# Patient Record
Sex: Male | Born: 1975 | Race: Black or African American | Hispanic: No | Marital: Married | Smoking: Never smoker
Health system: Southern US, Community
[De-identification: ages and names within clinical notes are randomized; demographics above are authoritative.]

## PROBLEM LIST (undated history)

## (undated) HISTORY — PX: APPENDECTOMY: SHX54

## (undated) HISTORY — PX: OTHER SURGICAL HISTORY: SHX169

---

## 2013-11-21 ENCOUNTER — Encounter (HOSPITAL_BASED_OUTPATIENT_CLINIC_OR_DEPARTMENT_OTHER): Payer: Self-pay | Admitting: Emergency Medicine

## 2013-11-21 ENCOUNTER — Emergency Department (HOSPITAL_BASED_OUTPATIENT_CLINIC_OR_DEPARTMENT_OTHER)
Admission: EM | Admit: 2013-11-21 | Discharge: 2013-11-21 | Disposition: A | Discharge status: Home / Self Care | Payer: Self-pay | Attending: Emergency Medicine | Admitting: Emergency Medicine

## 2013-11-21 ENCOUNTER — Emergency Department (HOSPITAL_BASED_OUTPATIENT_CLINIC_OR_DEPARTMENT_OTHER): Payer: Self-pay

## 2013-11-21 DIAGNOSIS — S82839A Other fracture of upper and lower end of unspecified fibula, initial encounter for closed fracture: Secondary | ICD-10-CM | POA: Insufficient documentation

## 2013-11-21 DIAGNOSIS — S82402A Unspecified fracture of shaft of left fibula, initial encounter for closed fracture: Secondary | ICD-10-CM

## 2013-11-21 DIAGNOSIS — Y9302 Activity, running: Secondary | ICD-10-CM | POA: Insufficient documentation

## 2013-11-21 DIAGNOSIS — Y9229 Other specified public building as the place of occurrence of the external cause: Secondary | ICD-10-CM | POA: Insufficient documentation

## 2013-11-21 DIAGNOSIS — X500XXA Overexertion from strenuous movement or load, initial encounter: Secondary | ICD-10-CM | POA: Insufficient documentation

## 2013-11-21 MED ORDER — HYDROCODONE-ACETAMINOPHEN 5-325 MG PO TABS: 1.0000 | ORAL_TABLET | Freq: Four times a day (QID) | ORAL | Status: DC | PRN

## 2013-11-21 MED ORDER — OXYCODONE-ACETAMINOPHEN 5-325 MG PO TABS
1.0000 | ORAL_TABLET | Freq: Four times a day (QID) | ORAL | Status: AC | PRN
Start: 2013-11-21 — End: ?

## 2013-11-21 MED ORDER — IBUPROFEN 600 MG PO TABS: 600.0000 mg | ORAL_TABLET | Freq: Four times a day (QID) | ORAL | Status: AC | PRN

## 2013-11-21 MED ORDER — OXYCODONE-ACETAMINOPHEN 5-325 MG PO TABS
1.0000 | ORAL_TABLET | Freq: Once | ORAL | Status: AC
  Administered 2013-11-21: 1 via ORAL
  Filled 2013-11-21: qty 1

## 2013-11-21 NOTE — ED Notes (Signed)
Heard and felt a pop in left knee while playing on obstacle course approx 1 hour PTA

## 2013-11-21 NOTE — ED Provider Notes (Signed)
CSN: [id number]     Arrival date & time 11/21/13  1049 History   First MD Initiated Contact with Patient 11/21/13 1204     Chief Complaint  Patient presents with  . Knee Injury     (Consider location/radiation/quality/duration/timing/severity/associated sxs/prior Treatment) HPI Comments: 38 y/o Caucasian male with a hx of ACL repairs on both knees presents an hour after suffering a knee injury.  States he was at his child's school event where he was running on an obstacle course when he planted his left foot in a awkward way and heard a "pop."  Immediately had a sharp pain mostly on the lateral and posterior aspect of his knee radiating down his leg.  States he iced his knee and didn't notice any significant swelling.  States he was able to ambulate but with pain. Has some numbness and tingling in his left foot and some pain in the left side of his hip.   The history is provided by the patient.    History reviewed. No pertinent past medical history. Past Surgical History  Procedure Laterality Date  . Knee surgery    . Appendectomy     No family history on file. History  Substance Use Topics  . Smoking status: Never Smoker   . Smokeless tobacco: Not on file  . Alcohol Use: No    Review of Systems  Constitutional: Positive for activity change.  Musculoskeletal: Positive for arthralgias.  Skin: Positive for wound.  Allergic/Immunologic: Negative for immunocompromised state.  Hematological: Does not bruise/bleed easily.      Allergies  Review of patient's allergies indicates no known allergies.  Home Medications   Prior to Admission medications   Medication Sig Start Date End Date Taking? Authorizing Provider  HYDROcodone-acetaminophen (NORCO/VICODIN) 5-325 MG per tablet Take 1 tablet by mouth every 6 (six) hours as needed. 11/21/13   Derwood KaplanAnkit Manasi Dishon, MD  ibuprofen (ADVIL,MOTRIN) 600 MG tablet Take 1 tablet (600 mg total) by mouth every 6 (six) hours as needed. 11/21/13   Ruble Buttler  Rhunette CroftNanavati, MD   BP 144/79  Pulse 83  Temp(Src) 98.2 F (36.8 C) (Oral)  Resp 20  Ht 6' (1.829 m)  Wt 195 lb (88.451 kg)  BMI 26.44 kg/m2  SpO2 100% Physical Exam  Nursing note and vitals reviewed. Constitutional: He is oriented to person, place, and time. He appears well-developed.  HENT:  Head: Normocephalic and atraumatic.  Eyes: Conjunctivae and EOM are normal. Pupils are equal, round, and reactive to light.  Neck: Normal range of motion. Neck supple.  Cardiovascular: Normal rate and regular rhythm.   Pulmonary/Chest: Effort normal and breath sounds normal.  Abdominal: Soft. Bowel sounds are normal. He exhibits no distension. There is no tenderness. There is no rebound and no guarding.  Musculoskeletal:  Lateral knee tenderness with some calf tenderness  Neurological: He is alert and oriented to person, place, and time.  Skin: Skin is warm.    ED Course  Procedures (including critical care time) Labs Review Labs Reviewed - No data to display  Imaging Review Dg Knee Complete 4 Views Left  11/21/2013   CLINICAL DATA:  Left knee pain. The patient felt a pop while running.  EXAM: LEFT KNEE - COMPLETE 4+ VIEW  COMPARISON:  None.  FINDINGS: The patient is status post ACL repair. There is an acute nondisplaced fracture of the fibular head. No other fracture is identified. No joint effusion is seen.  IMPRESSION: Acute nondisplaced fracture left fibular head. No other acute abnormality is identified.  Status post ACL repair.   Electronically Signed   By: Drusilla Kannerhomas  Dalessio M.D.   On: 11/21/2013 11:38     EKG Interpretation None      MDM   Final diagnoses:  Closed left fibular fracture   Pt comes in with knee pain. Xrays are showing a fracture, proximal fibular. Spoke with Dr. Wyline MoodWeller, who has taken care of the patient in the recent past - and he would like patient to be seen in the 2 weeks. Plan communicated  To the patient. RICE and non weight bearing until then with knee  immobilizer.  Derwood KaplanAnkit Schon Zeiders, MD 11/21/13 1623

## 2013-11-21 NOTE — Discharge Instructions (Signed)
Please see Dr. Wyline MoodWeller in 2 weeks. NO WEIGHT BEARING UNTIL THEN.  Fibular Fracture, Ankle, Adult, Treated With or Without Immobilization A fibular fracture at your ankle is a break (fracture) bone in the smallest of the two bones in your lower leg, located on the outside of your leg (fibula) close to the area at your ankle joint. CAUSES  Rolling your ankle.  Twisting your ankle.  Extreme flexing or extending of your foot.  Severe force on your ankle as when falling from a distance. RISK FACTORS  Jumping activities.  Participation in sports.  Osteoporosis.  Advanced age.  Previous ankle injuries. SIGNS AND SYMPTOMS  Pain.  Swelling.  Inability to put weight on injured ankle.  Bruising.  Bone deformities at site of injury. DIAGNOSIS  This fracture is diagnosed with the help of an X-ray exam. TREATMENT  If the fractured bone did not move out of place it usually will heal without problems and does casting or splinting. If immobilization is needed for comfort or the fractured bone moved out of place and will not heal properly with immobilization, a cast or splint will be used. HOME CARE INSTRUCTIONS   Apply ice to the area of injury:  Put ice in a plastic bag.  Place a towel between your skin and the bag.  Leave the ice on for 20 minutes, 2 3 times a day.  Use crutches as directed. Resume walking without crutches as directed by your health care provider.  Only take over-the-counter or prescription medicines for pain, discomfort, or fever as directed by your health care provider.  If you have a removable splint or boot, do not remove the boot unless directed by your health care provider. SEEK MEDICAL CARE IF:   You have continued pain or more swelling  The medications do not control the pain. SEEK IMMEDIATE MEDICAL CARE IF:  You develop severe pain in the leg or foot.  Your skin or nails below the injury turn blue or grey or feel cold or numb. MAKE SURE YOU:     Understand these instructions.  Will watch your condition.  Will get help right away if you are not doing well or get worse. Document Released: 07/05/2005 Document Revised: 04/25/2013 Document Reviewed: 02/14/2013 [facility] Patient Information 2014 [employer], [place].

## 2015-10-25 IMAGING — CR DG KNEE COMPLETE 4+V*L*
4 series · 4 of 4 positions shown · non-contrast
Comparison: None.

CLINICAL DATA: Left knee pain. The patient felt a pop while
running.

EXAM:
LEFT KNEE - COMPLETE 4+ VIEW

[t knee ap left]
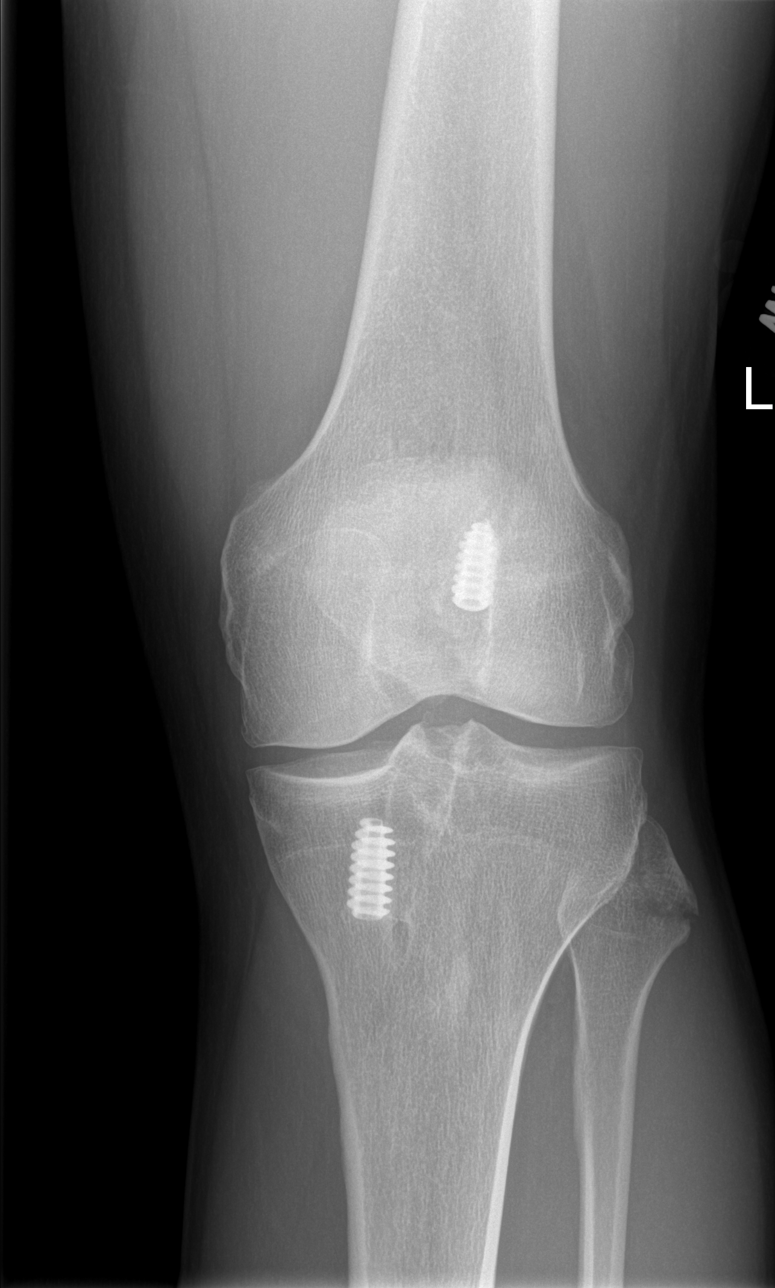

[t knee oblique left (1 of 2)]
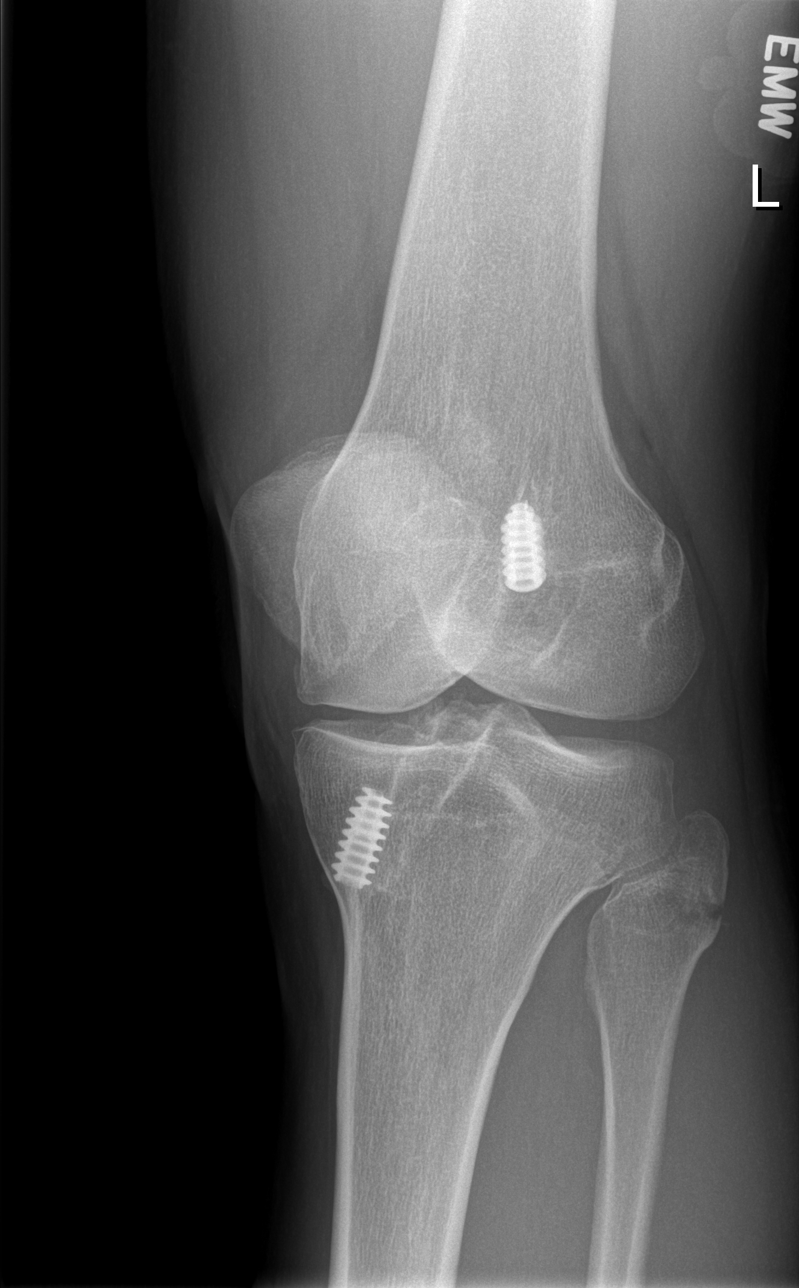

[t knee oblique left (2 of 2)]
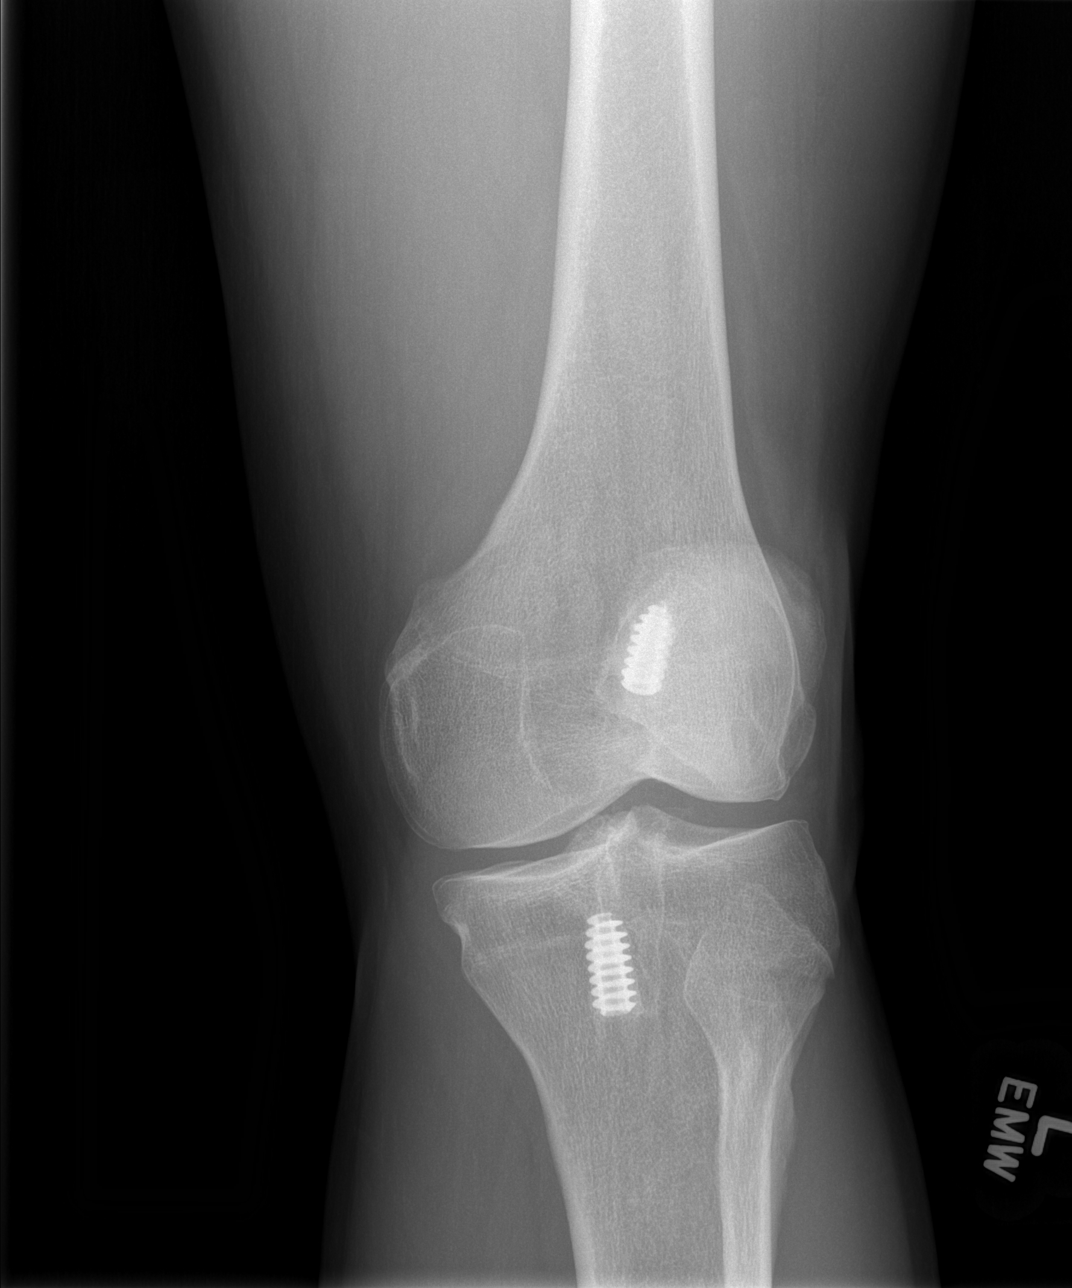

[t knee lat left]
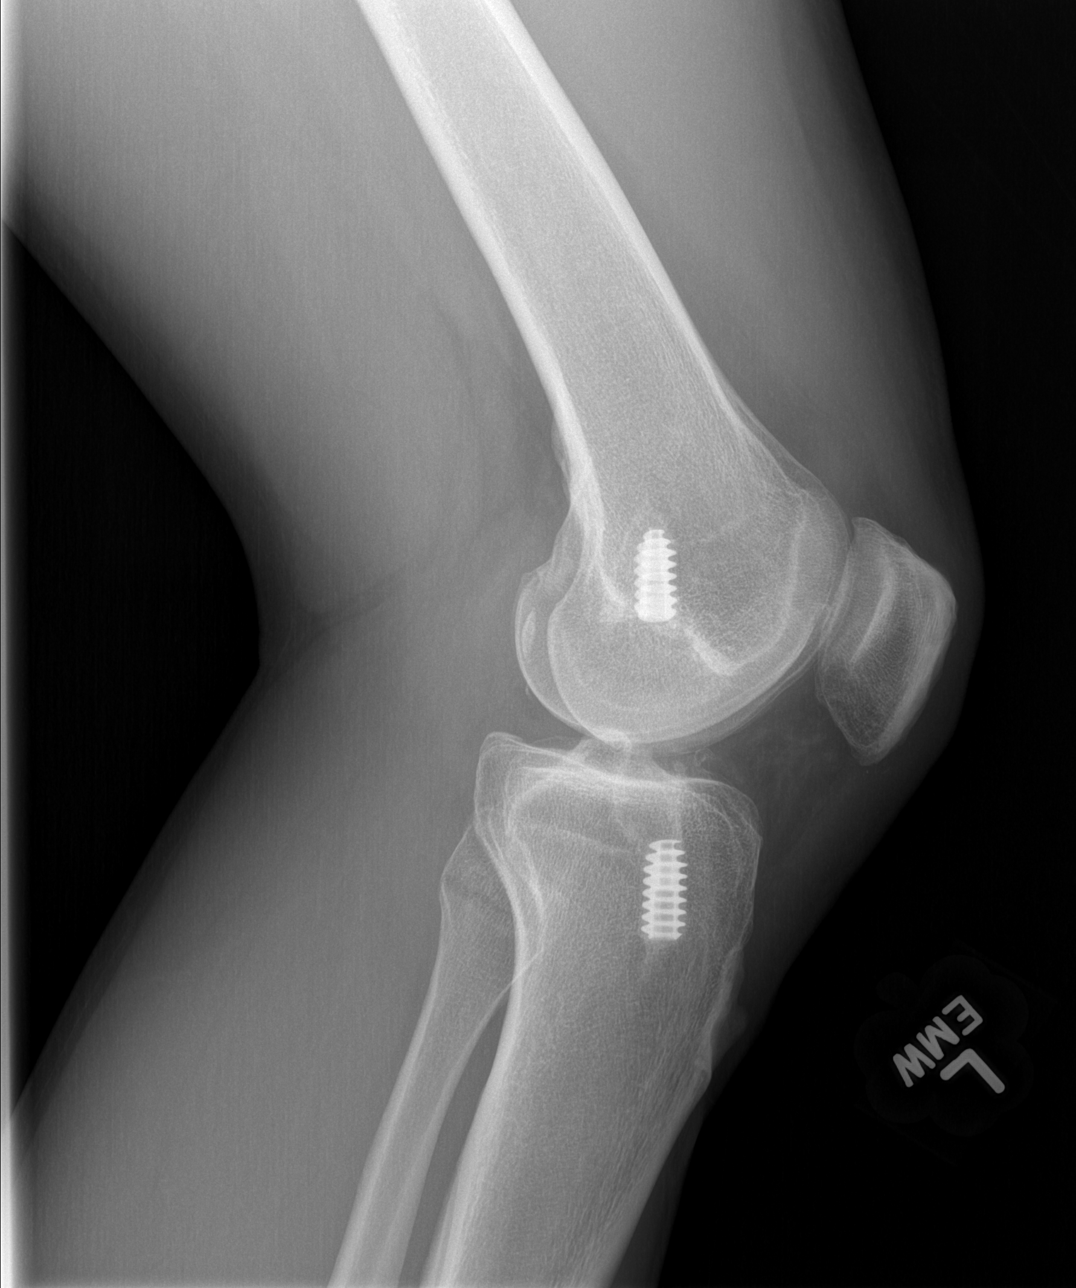

[4 of 4 positions shown; findings below may reference images not displayed]

FINDINGS: The patient is status post ACL repair. There is an acute
nondisplaced fracture of the fibular head. No other fracture is
identified. No joint effusion is seen.
IMPRESSION: Acute nondisplaced fracture left fibular head. No other acute
abnormality is identified.

Status post ACL repair.

## 2018-02-23 ENCOUNTER — Other Ambulatory Visit: Payer: Self-pay | Admitting: Orthopedic Surgery

## 2018-03-07 ENCOUNTER — Ambulatory Visit (HOSPITAL_BASED_OUTPATIENT_CLINIC_OR_DEPARTMENT_OTHER): Admission: RE | Admit: 2018-03-07 | Payer: Self-pay | Source: Ambulatory Visit | Admitting: Orthopedic Surgery

## 2018-03-07 ENCOUNTER — Encounter (HOSPITAL_BASED_OUTPATIENT_CLINIC_OR_DEPARTMENT_OTHER): Admission: RE | Payer: Self-pay | Source: Ambulatory Visit

## 2018-03-07 SURGERY — ARTHROSCOPY, KNEE, WITH MEDIAL MENISCECTOMY
Anesthesia: Choice | Laterality: Right
